# Patient Record
Sex: Male | Born: 1950 | Race: Black or African American | Hispanic: No | Marital: Single | Smoking: Never smoker
Health system: Southern US, Community
[De-identification: ages and names within clinical notes are randomized; demographics above are authoritative.]

## PROBLEM LIST (undated history)

## (undated) DIAGNOSIS — I10 Essential (primary) hypertension: Secondary | ICD-10-CM

## (undated) DIAGNOSIS — J189 Pneumonia, unspecified organism: Secondary | ICD-10-CM

## (undated) DIAGNOSIS — G43909 Migraine, unspecified, not intractable, without status migrainosus: Secondary | ICD-10-CM

## (undated) DIAGNOSIS — G3184 Mild cognitive impairment, so stated: Secondary | ICD-10-CM

## (undated) DIAGNOSIS — M549 Dorsalgia, unspecified: Secondary | ICD-10-CM

## (undated) DIAGNOSIS — M542 Cervicalgia: Secondary | ICD-10-CM

## (undated) DIAGNOSIS — S0990XA Unspecified injury of head, initial encounter: Secondary | ICD-10-CM

## (undated) HISTORY — DX: Migraine, unspecified, not intractable, without status migrainosus: G43.909

## (undated) HISTORY — DX: Mild cognitive impairment of uncertain or unknown etiology: G31.84

## (undated) HISTORY — DX: Cervicalgia: M54.2

## (undated) HISTORY — DX: Pneumonia, unspecified organism: J18.9

## (undated) HISTORY — DX: Dorsalgia, unspecified: M54.9

## (undated) HISTORY — DX: Unspecified injury of head, initial encounter: S09.90XA

## (undated) HISTORY — DX: Essential (primary) hypertension: I10

---

## 2012-12-02 ENCOUNTER — Other Ambulatory Visit: Payer: Self-pay | Admitting: Neurology

## 2012-12-02 DIAGNOSIS — G3184 Mild cognitive impairment, so stated: Secondary | ICD-10-CM

## 2012-12-05 ENCOUNTER — Ambulatory Visit (HOSPITAL_COMMUNITY): Payer: Self-pay

## 2013-01-16 DIAGNOSIS — F32A Depression, unspecified: Secondary | ICD-10-CM | POA: Insufficient documentation

## 2013-01-16 DIAGNOSIS — I10 Essential (primary) hypertension: Secondary | ICD-10-CM | POA: Insufficient documentation

## 2013-08-18 DIAGNOSIS — R296 Repeated falls: Secondary | ICD-10-CM | POA: Insufficient documentation

## 2015-06-29 DIAGNOSIS — R0609 Other forms of dyspnea: Secondary | ICD-10-CM | POA: Insufficient documentation

## 2015-06-29 DIAGNOSIS — R9431 Abnormal electrocardiogram [ECG] [EKG]: Secondary | ICD-10-CM | POA: Insufficient documentation

## 2016-04-16 DIAGNOSIS — N529 Male erectile dysfunction, unspecified: Secondary | ICD-10-CM | POA: Insufficient documentation

## 2018-06-26 ENCOUNTER — Other Ambulatory Visit: Payer: Self-pay | Admitting: Neurology

## 2018-06-26 DIAGNOSIS — R2689 Other abnormalities of gait and mobility: Secondary | ICD-10-CM

## 2018-11-11 ENCOUNTER — Telehealth: Payer: Self-pay | Admitting: Radiology

## 2018-11-11 NOTE — Telephone Encounter (Signed)
Patient called, said that Dr Corey Skains referred him to see Dr Aline Brochure for upper and lower back pain.  They faxed a referral to [place].  Please call patient back once we have referral and know if we can schedule him.  Thanks.

## 2018-11-11 NOTE — Telephone Encounter (Signed)
I returned patient's call to discuss scheduling; no voice mail is set up; unable to leave a message.

## 2018-11-13 NOTE — Telephone Encounter (Signed)
Referral received - Dr Merlene Laughter, [facility].

## 2018-12-29 ENCOUNTER — Encounter: Payer: Self-pay | Admitting: Orthopedic Surgery

## 2018-12-29 ENCOUNTER — Other Ambulatory Visit: Payer: Self-pay

## 2018-12-29 ENCOUNTER — Ambulatory Visit (INDEPENDENT_AMBULATORY_CARE_PROVIDER_SITE_OTHER): Payer: Medicare Other | Admitting: Orthopedic Surgery

## 2018-12-29 ENCOUNTER — Ambulatory Visit: Payer: Medicare Other

## 2018-12-29 VITALS — BP 154/100 | HR 73 | Temp 97.0°F | Ht 69.0 in | Wt 190.0 lb

## 2018-12-29 DIAGNOSIS — G8929 Other chronic pain: Secondary | ICD-10-CM

## 2018-12-29 DIAGNOSIS — M5440 Lumbago with sciatica, unspecified side: Secondary | ICD-10-CM | POA: Diagnosis not present

## 2018-12-29 MED ORDER — MELOXICAM 7.5 MG PO TABS: 7.5000 mg | ORAL_TABLET | Freq: Every day | ORAL | 5 refills | Status: AC

## 2018-12-29 MED ORDER — GABAPENTIN 100 MG PO CAPS: 100.0000 mg | ORAL_CAPSULE | Freq: Three times a day (TID) | ORAL | 2 refills | Status: AC
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
## 2018-12-29 NOTE — Patient Instructions (Signed)
I will call you with the results of the MRI   I can only give you non opioids for pain.

## 2019-01-26 ENCOUNTER — Ambulatory Visit
Admission: RE | Admit: 2019-01-26 | Discharge: 2019-01-26 | Disposition: A | Discharge status: Home / Self Care | Payer: Medicare Other | Source: Ambulatory Visit | Attending: Orthopedic Surgery | Admitting: Orthopedic Surgery

## 2019-01-26 ENCOUNTER — Other Ambulatory Visit: Payer: Self-pay

## 2019-01-26 DIAGNOSIS — G8929 Other chronic pain: Secondary | ICD-10-CM

## 2019-01-26 DIAGNOSIS — M5440 Lumbago with sciatica, unspecified side: Secondary | ICD-10-CM

## 2019-01-27 ENCOUNTER — Telehealth: Payer: Self-pay | Admitting: Orthopedic Surgery

## 2019-01-27 ENCOUNTER — Telehealth: Payer: Self-pay | Admitting: Radiology

## 2019-01-27 DIAGNOSIS — G8929 Other chronic pain: Secondary | ICD-10-CM

## 2019-01-27 DIAGNOSIS — M5440 Lumbago with sciatica, unspecified side: Secondary | ICD-10-CM

## 2019-01-27 NOTE — Telephone Encounter (Signed)
-----   Message from Carole Civil, MD sent at 01/27/2019  9:37 AM EST ----- Neuro surgery referral
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
## 2019-02-23 ENCOUNTER — Telehealth: Payer: Self-pay | Admitting: Orthopedic Surgery

## 2019-02-23 NOTE — Telephone Encounter (Signed)
Patient called, said thought he signed a release form for records for his office visit in November with Dr Romeo Apple. Per following up with [facility], and with in-house record requests, none found. Mailed request form to patient; aware to return to office for in-house request to be done once signed release form is returned.

## 2019-04-13 ENCOUNTER — Telehealth: Payer: Self-pay | Admitting: Orthopedic Surgery

## 2019-04-13 NOTE — Telephone Encounter (Signed)
Patient called to inquire about having order for physical therapy for his back; also mentioned neck, as his orders will be running out. York Spaniel he will continue to have therapy in [place] where he lives.  Or if needs another appointment, said he comes to the area on certain Tuesdays.

## 2019-04-13 NOTE — Telephone Encounter (Signed)
Dr Romeo Apple has advised him to see Neurosurgeon, so it is up to Neurosurgeon whether or not he needs more therapy  I called him to advise he has voiced understanding  He states he signed a medical records release And wants his notes sent to him, I told him I would ask if you can help with that.

## 2019-04-13 NOTE — Telephone Encounter (Signed)
Called patient and confirmed that we had previously received his signed authorization and we mailed his records 03/10/19. He acknowledged he did receive them.
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
